# Patient Record
Sex: Female | Born: 1995 | Race: White | Hispanic: No | Marital: Single | Smoking: Never smoker
Health system: Southern US, Community
[De-identification: ages and names within clinical notes are randomized; demographics above are authoritative.]

## PROBLEM LIST (undated history)

## (undated) DIAGNOSIS — J45909 Unspecified asthma, uncomplicated: Secondary | ICD-10-CM

---
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
## 2016-12-08 IMAGING — DX DG CHEST 2V
2 series · 2 of 2 positions shown · non-contrast
Comparison: None.

CLINICAL DATA: Cough, sore throat and body aches since last evening
with fever today.

EXAM:
CHEST  2 VIEW

[chest pa]
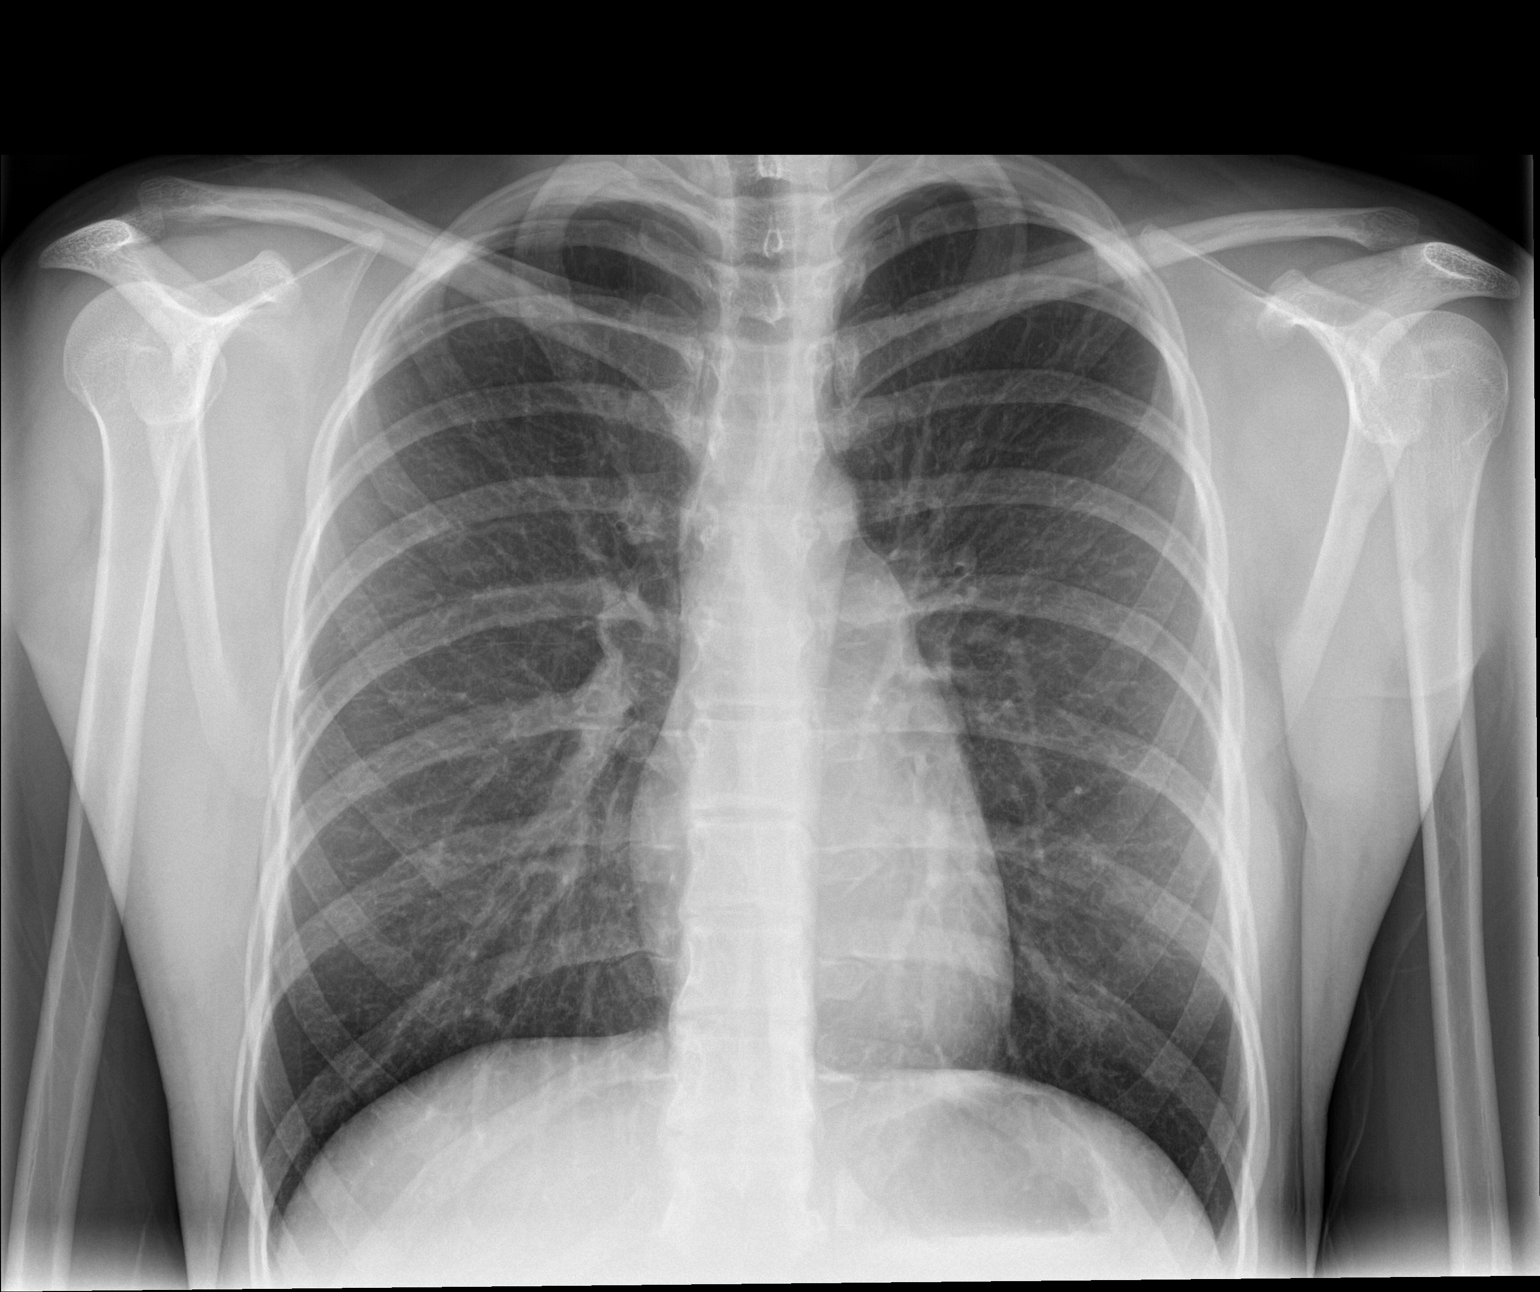

[chest lat]
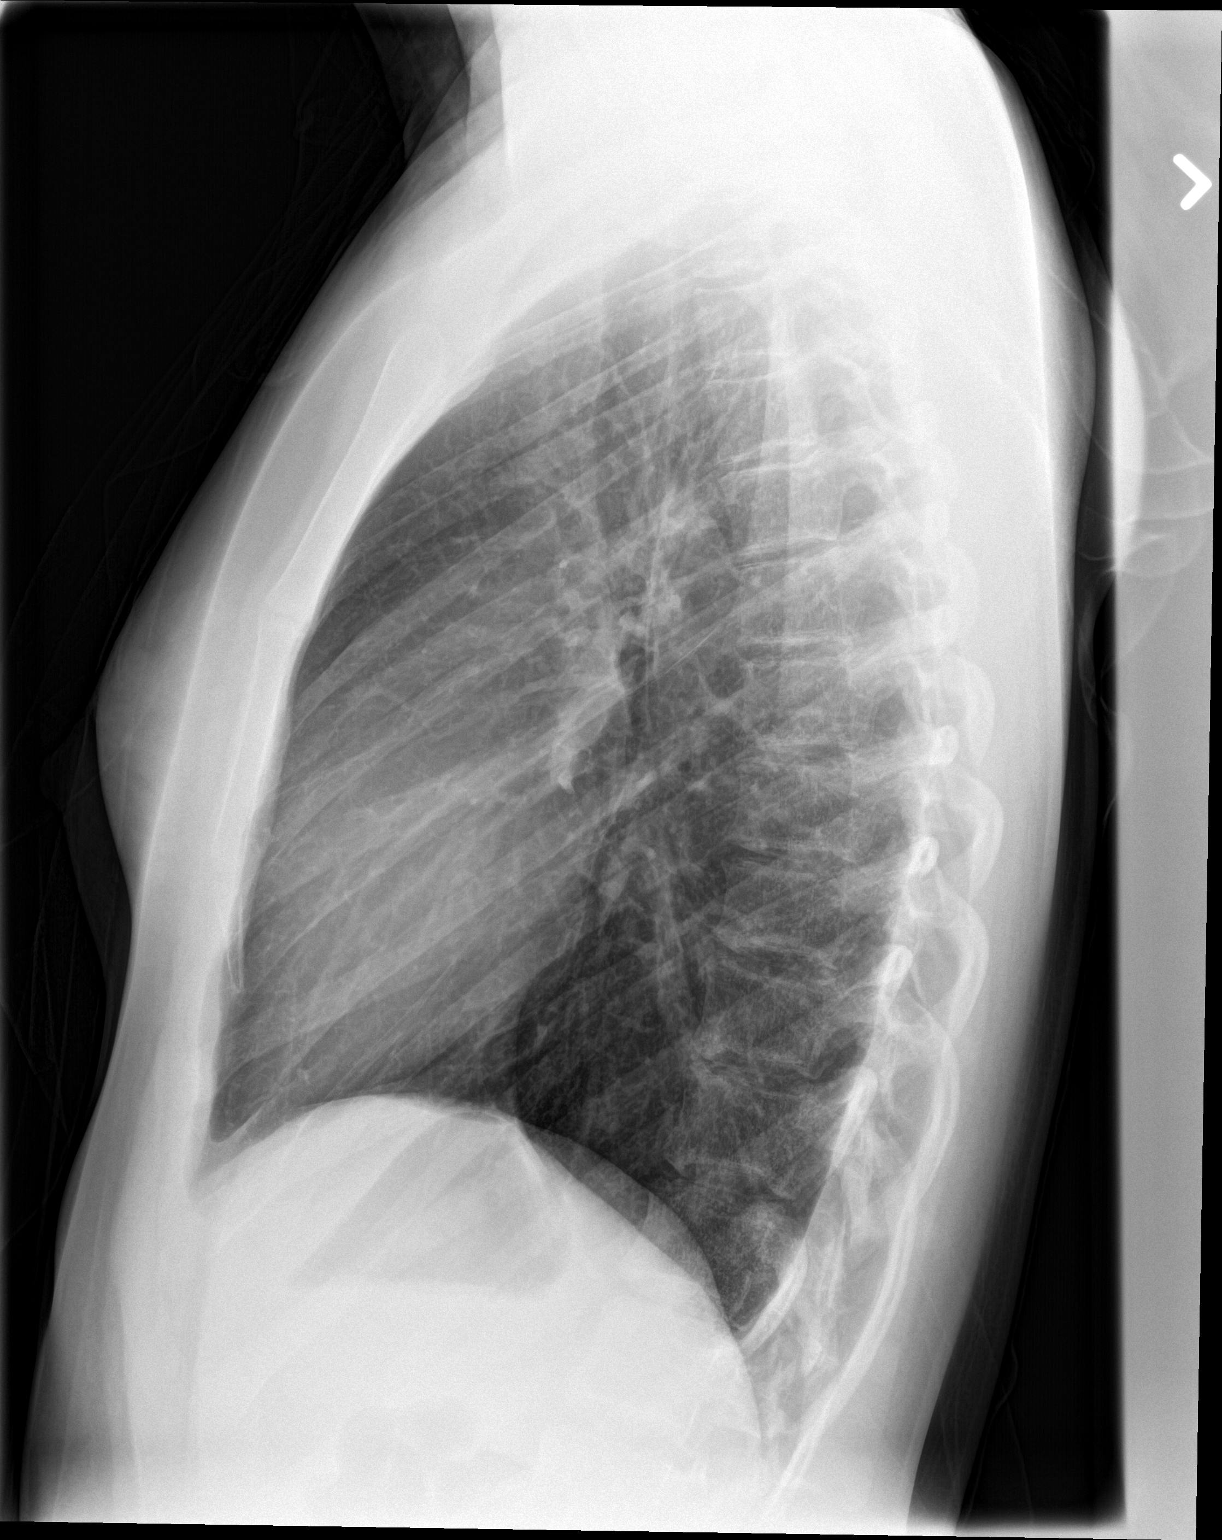

[2 of 2 positions shown; findings below may reference images not displayed]

FINDINGS: The heart size and mediastinal contours are within normal limits.
Both lungs are clear. The visualized skeletal structures are
unremarkable.
IMPRESSION: Normal chest x-ray.
